# Patient Record
Sex: Male | Born: 1995 | Race: White | Hispanic: No | Marital: Single | Smoking: Never smoker
Health system: Southern US, Community
[De-identification: ages and names within clinical notes are randomized; demographics above are authoritative.]

## PROBLEM LIST (undated history)

## (undated) DIAGNOSIS — R109 Unspecified abdominal pain: Secondary | ICD-10-CM

## (undated) HISTORY — DX: Unspecified abdominal pain: R10.9

---
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
## 2018-07-16 ENCOUNTER — Ambulatory Visit: Payer: BC Managed Care – PPO | Admitting: Clinical
